# Patient Record
Sex: Male | Born: 1960 | Race: Black or African American | Hispanic: No | Marital: Single | Smoking: Current every day smoker
Health system: Southern US, Community
[De-identification: ages and names within clinical notes are randomized; demographics above are authoritative.]

---

## 2012-10-22 ENCOUNTER — Emergency Department (HOSPITAL_COMMUNITY)

## 2012-10-22 ENCOUNTER — Emergency Department (HOSPITAL_COMMUNITY)
Admission: EM | Admit: 2012-10-22 | Discharge: 2012-10-22 | Disposition: A | Discharge status: Home / Self Care | Attending: Emergency Medicine | Admitting: Emergency Medicine

## 2012-10-22 ENCOUNTER — Encounter (HOSPITAL_COMMUNITY): Payer: Self-pay | Admitting: Emergency Medicine

## 2012-10-22 DIAGNOSIS — M549 Dorsalgia, unspecified: Secondary | ICD-10-CM

## 2012-10-22 DIAGNOSIS — M545 Low back pain, unspecified: Secondary | ICD-10-CM | POA: Insufficient documentation

## 2012-10-22 DIAGNOSIS — Y929 Unspecified place or not applicable: Secondary | ICD-10-CM | POA: Insufficient documentation

## 2012-10-22 DIAGNOSIS — S335XXA Sprain of ligaments of lumbar spine, initial encounter: Secondary | ICD-10-CM | POA: Insufficient documentation

## 2012-10-22 DIAGNOSIS — T148XXA Other injury of unspecified body region, initial encounter: Secondary | ICD-10-CM

## 2012-10-22 DIAGNOSIS — F17200 Nicotine dependence, unspecified, uncomplicated: Secondary | ICD-10-CM | POA: Insufficient documentation

## 2012-10-22 DIAGNOSIS — Y939 Activity, unspecified: Secondary | ICD-10-CM | POA: Insufficient documentation

## 2012-10-22 DIAGNOSIS — X58XXXA Exposure to other specified factors, initial encounter: Secondary | ICD-10-CM | POA: Insufficient documentation

## 2012-10-22 MED ORDER — METHOCARBAMOL 500 MG PO TABS: 500.0000 mg | ORAL_TABLET | Freq: Two times a day (BID) | ORAL | Status: DC | PRN

## 2012-10-22 MED ORDER — HYDROCODONE-ACETAMINOPHEN 5-325 MG PO TABS
2.0000 | ORAL_TABLET | Freq: Once | ORAL | Status: AC
  Administered 2012-10-22: 2 via ORAL
  Filled 2012-10-22: qty 2

## 2012-10-22 MED ORDER — HYDROCODONE-ACETAMINOPHEN 5-325 MG PO TABS: 2.0000 | ORAL_TABLET | Freq: Four times a day (QID) | ORAL | Status: DC | PRN

## 2012-10-22 NOTE — ED Notes (Signed)
Back pain started today  Lower

## 2012-10-22 NOTE — ED Notes (Signed)
Discharge instructions reviewed. Pt verbalized understanding.  

## 2012-10-22 NOTE — ED Provider Notes (Signed)
History     CSN: [id number]  Arrival date & time 10/22/12  1350   First MD Initiated Contact with Patient 10/22/12 1411      Chief Complaint  Patient presents with  . Back Pain    (Consider location/radiation/quality/duration/timing/severity/associated sxs/prior treatment) HPI Comments: Patient is a 52 year old male who presents with new onset back pain this morning. He does not recall an injury or doing anything out of the ordinary yesterday. He reports pain across his low back. No numbness or tingling radiating down the legs. No weakness. Ambulatory without antalgic gait. Movement makes the pain worse. Nothing has made the pain better. No hx of CA, IVDA, bowel or bladder incontinence. No fevers, chills, abdominal pain, shortness of breath, chest pain.   The history is provided by the patient. No language interpreter was used.    History reviewed. No pertinent past medical history.  No past surgical history on file.  No family history on file.  History  Substance Use Topics  . Smoking status: Current Every Day Smoker  . Smokeless tobacco: Not on file  . Alcohol Use: Yes      Review of Systems  Constitutional: Negative for fever and chills.  Respiratory: Negative for shortness of breath.   Cardiovascular: Negative for chest pain and leg swelling.  Gastrointestinal: Negative for nausea, vomiting and abdominal pain.  Musculoskeletal: Positive for back pain. Negative for gait problem.  Neurological: Negative for numbness.  All other systems reviewed and are negative.    Allergies  Review of patient's allergies indicates no known allergies.  Home Medications  No current outpatient prescriptions on file.  BP 99/58  Pulse 58  Temp(Src) 98.6 F (37 C)  Resp 16  SpO2 99%  Physical Exam  Nursing note and vitals reviewed. Constitutional: He is oriented to person, place, and time. Vital signs are normal. He appears well-developed and well-nourished. No distress.   HENT:  Head: Normocephalic and atraumatic.  Right Ear: External ear normal.  Left Ear: External ear normal.  Nose: Nose normal.  Eyes: Conjunctivae are normal.  Neck: Normal range of motion. No tracheal deviation present.  Cardiovascular: Normal rate, regular rhythm, normal heart sounds, intact distal pulses and normal pulses.   Pulmonary/Chest: Effort normal and breath sounds normal. No stridor.  Abdominal: Soft. He exhibits no distension. There is no tenderness.  Musculoskeletal: Normal range of motion.       Thoracic back: He exhibits no tenderness and no bony tenderness.       Lumbar back: He exhibits tenderness, bony tenderness, pain and spasm. He exhibits no deformity.  No deformity, step-offs  Neurological: He is alert and oriented to person, place, and time. He has normal strength. No sensory deficit. He exhibits normal muscle tone. Coordination and gait normal.  Skin: Skin is warm and dry. He is not diaphoretic.  Psychiatric: He has a normal mood and affect. His behavior is normal.    ED Course  Procedures (including critical care time)  Labs Reviewed - No data to display Dg Lumbar Spine Complete  10/22/2012  *RADIOLOGY REPORT*  Clinical Data: Low back pain  LUMBAR SPINE - COMPLETE 4+ VIEW  Comparison:  None.  Findings:  There is no evidence of lumbar spine fracture. Alignment is normal.  Intervertebral disc spaces are maintained. Atheromatous aortic calcification noted without calcified aneurysm.  IMPRESSION: Negative.   Original Report Authenticated By: Christiana Pellant, M.D.      1. Back pain   2. Muscle strain  MDM  Patient with back pain.  No neurological deficits and normal neuro exam.  Patient can walk but states is painful.  No loss of bowel or bladder control.  No concern for cauda equina.  No fever, night sweats, weight loss, h/o cancer, IVDU.  RICE protocol and pain medicine indicated and discussed with patient.    Mora Bellman, PA-C 10/23/12 2033

## 2012-10-24 NOTE — ED Provider Notes (Signed)
Medical screening examination/treatment/procedure(s) were performed by non-physician practitioner and as supervising physician I was immediately available for consultation/collaboration.  Martha K Linker, MD 10/24/12 0706 

## 2012-12-29 ENCOUNTER — Encounter (HOSPITAL_COMMUNITY): Payer: Self-pay | Admitting: Nurse Practitioner

## 2012-12-29 ENCOUNTER — Emergency Department (HOSPITAL_COMMUNITY)
Admission: EM | Admit: 2012-12-29 | Discharge: 2012-12-29 | Disposition: A | Discharge status: Home / Self Care | Attending: Emergency Medicine | Admitting: Emergency Medicine

## 2012-12-29 DIAGNOSIS — Y939 Activity, unspecified: Secondary | ICD-10-CM | POA: Insufficient documentation

## 2012-12-29 DIAGNOSIS — S0993XA Unspecified injury of face, initial encounter: Secondary | ICD-10-CM | POA: Insufficient documentation

## 2012-12-29 DIAGNOSIS — F17200 Nicotine dependence, unspecified, uncomplicated: Secondary | ICD-10-CM | POA: Insufficient documentation

## 2012-12-29 DIAGNOSIS — S199XXA Unspecified injury of neck, initial encounter: Secondary | ICD-10-CM | POA: Insufficient documentation

## 2012-12-29 DIAGNOSIS — Y92410 Unspecified street and highway as the place of occurrence of the external cause: Secondary | ICD-10-CM | POA: Insufficient documentation

## 2012-12-29 DIAGNOSIS — M7918 Myalgia, other site: Secondary | ICD-10-CM

## 2012-12-29 DIAGNOSIS — IMO0002 Reserved for concepts with insufficient information to code with codable children: Secondary | ICD-10-CM | POA: Insufficient documentation

## 2012-12-29 MED ORDER — DIAZEPAM 5 MG PO TABS: ORAL_TABLET | ORAL | Status: DC

## 2012-12-29 MED ORDER — OXYCODONE-ACETAMINOPHEN 5-325 MG PO TABS: ORAL_TABLET | ORAL | Status: DC
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
## 2012-12-30 NOTE — ED Provider Notes (Signed)
Medical screening examination/treatment/procedure(s) were performed by non-physician practitioner and as supervising physician I was immediately available for consultation/collaboration.   Ashby Dawes, MD 12/30/12 [phone]

## 2014-03-27 ENCOUNTER — Emergency Department (HOSPITAL_COMMUNITY)
Admission: EM | Admit: 2014-03-27 | Discharge: 2014-03-27 | Disposition: A | Discharge status: Home / Self Care | Attending: Emergency Medicine | Admitting: Emergency Medicine

## 2014-03-27 ENCOUNTER — Emergency Department (HOSPITAL_COMMUNITY)

## 2014-03-27 ENCOUNTER — Encounter (HOSPITAL_COMMUNITY): Payer: Self-pay | Admitting: Emergency Medicine

## 2014-03-27 DIAGNOSIS — R112 Nausea with vomiting, unspecified: Secondary | ICD-10-CM | POA: Diagnosis not present

## 2014-03-27 DIAGNOSIS — J209 Acute bronchitis, unspecified: Secondary | ICD-10-CM | POA: Insufficient documentation

## 2014-03-27 DIAGNOSIS — R197 Diarrhea, unspecified: Secondary | ICD-10-CM | POA: Diagnosis not present

## 2014-03-27 DIAGNOSIS — F17200 Nicotine dependence, unspecified, uncomplicated: Secondary | ICD-10-CM

## 2014-03-27 DIAGNOSIS — Z720 Tobacco use: Secondary | ICD-10-CM | POA: Insufficient documentation

## 2014-03-27 DIAGNOSIS — J40 Bronchitis, not specified as acute or chronic: Secondary | ICD-10-CM

## 2014-03-27 DIAGNOSIS — R079 Chest pain, unspecified: Secondary | ICD-10-CM | POA: Diagnosis present

## 2014-03-27 LAB — CBC WITH DIFFERENTIAL/PLATELET
BASOS ABS: 0.1 10*3/uL (ref 0.0–0.1)
BASOS PCT: 1 % (ref 0–1)
Eosinophils Absolute: 0.2 10*3/uL (ref 0.0–0.7)
Eosinophils Relative: 2 % (ref 0–5)
HCT: 47.4 % (ref 39.0–52.0)
HEMOGLOBIN: 16.4 g/dL (ref 13.0–17.0)
Lymphocytes Relative: 23 % (ref 12–46)
Lymphs Abs: 1.9 10*3/uL (ref 0.7–4.0)
MCH: 31.9 pg (ref 26.0–34.0)
MCHC: 34.6 g/dL (ref 30.0–36.0)
MCV: 92.2 fL (ref 78.0–100.0)
MONOS PCT: 9 % (ref 3–12)
Monocytes Absolute: 0.7 10*3/uL (ref 0.1–1.0)
NEUTROS ABS: 5.5 10*3/uL (ref 1.7–7.7)
NEUTROS PCT: 65 % (ref 43–77)
PLATELETS: 220 10*3/uL (ref 150–400)
RBC: 5.14 MIL/uL (ref 4.22–5.81)
RDW: 14 % (ref 11.5–15.5)
WBC: 8.4 10*3/uL (ref 4.0–10.5)

## 2014-03-27 LAB — BASIC METABOLIC PANEL
Anion gap: 17 — ABNORMAL HIGH (ref 5–15)
BUN: 9 mg/dL (ref 6–23)
CO2: 22 meq/L (ref 19–32)
Calcium: 9.5 mg/dL (ref 8.4–10.5)
Chloride: 100 mEq/L (ref 96–112)
Creatinine, Ser: 1.28 mg/dL (ref 0.50–1.35)
GFR calc Af Amer: 72 mL/min — ABNORMAL LOW (ref >90)
GFR, EST NON AFRICAN AMERICAN: 62 mL/min — AB (ref >90)
GLUCOSE: 96 mg/dL (ref 70–99)
POTASSIUM: 4.5 meq/L (ref 3.7–5.3)
SODIUM: 139 meq/L (ref 137–147)

## 2014-03-27 LAB — I-STAT CG4 LACTIC ACID, ED: LACTIC ACID, VENOUS: 0.7 mmol/L (ref 0.5–2.2)

## 2014-03-27 LAB — I-STAT TROPONIN, ED
TROPONIN I, POC: 0 ng/mL (ref 0.00–0.08)
TROPONIN I, POC: 0 ng/mL (ref 0.00–0.08)

## 2014-03-27 MED ORDER — HYDROCODONE-ACETAMINOPHEN 7.5-325 MG/15ML PO SOLN: 15.0000 mL | Freq: Three times a day (TID) | ORAL | Status: AC | PRN

## 2014-03-27 MED ORDER — METHYLPREDNISOLONE SODIUM SUCC 125 MG IJ SOLR
125.0000 mg | Freq: Once | INTRAMUSCULAR | Status: AC
  Administered 2014-03-27: 125 mg via INTRAVENOUS
  Filled 2014-03-27: qty 2

## 2014-03-27 MED ORDER — ALBUTEROL SULFATE HFA 108 (90 BASE) MCG/ACT IN AERS
2.0000 | INHALATION_SPRAY | Freq: Once | RESPIRATORY_TRACT | Status: AC
  Administered 2014-03-27: 2 via RESPIRATORY_TRACT
  Filled 2014-03-27: qty 6.7

## 2014-03-27 MED ORDER — PREDNISONE 50 MG PO TABS: ORAL_TABLET | ORAL | Status: AC

## 2014-03-27 MED ORDER — SODIUM CHLORIDE 0.9 % IV BOLUS (SEPSIS)
1000.0000 mL | Freq: Once | INTRAVENOUS | Status: AC
  Administered 2014-03-27: 1000 mL via INTRAVENOUS

## 2014-03-27 MED ORDER — MORPHINE SULFATE 4 MG/ML IJ SOLN
4.0000 mg | Freq: Once | INTRAMUSCULAR | Status: AC
Start: 2014-03-27 — End: 2014-03-27
  Administered 2014-03-27: 4 mg via INTRAVENOUS
  Filled 2014-03-27: qty 1

## 2014-03-27 MED ORDER — ONDANSETRON HCL 4 MG/2ML IJ SOLN
4.0000 mg | Freq: Once | INTRAMUSCULAR | Status: AC
  Administered 2014-03-27: 4 mg via INTRAVENOUS
  Filled 2014-03-27: qty 2

## 2014-03-27 MED ORDER — IPRATROPIUM-ALBUTEROL 0.5-2.5 (3) MG/3ML IN SOLN
3.0000 mL | Freq: Once | RESPIRATORY_TRACT | Status: AC
  Administered 2014-03-27: 3 mL via RESPIRATORY_TRACT
  Filled 2014-03-27: qty 3

## 2014-03-27 MED ORDER — PROMETHAZINE HCL 25 MG PO TABS: 25.0000 mg | ORAL_TABLET | Freq: Four times a day (QID) | ORAL | Status: AC | PRN

## 2014-03-27 MED ORDER — AZITHROMYCIN 250 MG PO TABS: ORAL_TABLET | ORAL | Status: DC

## 2014-03-27 NOTE — ED Provider Notes (Signed)
Medical screening examination/treatment/procedure(s) were performed by non-physician practitioner and as supervising physician I was immediately available for consultation/collaboration.   EKG Interpretation   Date/Time:  Monday March 27 2014 16:28:52 EDT Ventricular Rate:  86 PR Interval:  148 QRS Duration: 80 QT Interval:  368 QTC Calculation: 440 R Axis:   82 Text Interpretation:  Normal sinus rhythm Normal ECG No previous tracing  Confirmed by Anitra LauthPLUNKETT  MD, Alphonzo LemmingsWHITNEY ([id number]) on 03/27/2014 9:27:26 PM        Gwyneth SproutWhitney Gor Vestal, MD 03/27/14 2317

## 2014-03-27 NOTE — ED Notes (Signed)
Pt c/o mid sternal CP with cough x 2 days

## 2014-03-27 NOTE — ED Provider Notes (Signed)
CSN: [id number]     Arrival date & time 03/27/14  1618 History   First MD Initiated Contact with Patient 03/27/14 2049     Chief Complaint  Patient presents with  . Chest Pain     (Consider location/radiation/quality/duration/timing/severity/associated sxs/prior Treatment) HPI  Dareen PianoJames Stclair is a 53 y.o. male complaining of pleuritic bilateral anterior chest pain, productive cough, chills, shortness of breath, several episodes of nonbloody, nonbilious, coffee-ground emesis, several episodes of loose stool worsening over the course of 3 days. Patient denies sick contacts, headache, cervicalgia, rash, abdominal pain. Patient is active daily smoker, has not had his flu shot, has no history of COPD diagnosis. History of DVT or PE, recent immobilization, calf pain or leg swelling.  [facility]   History reviewed. No pertinent past medical history. History reviewed. No pertinent past surgical history. History reviewed. No pertinent family history. History  Substance Use Topics  . Smoking status: Current Every Day Smoker    Types: Cigarettes  . Smokeless tobacco: Not on file  . Alcohol Use: Yes    Review of Systems  10 systems reviewed and found to be negative, except as noted in the HPI.    Allergies  Review of patient's allergies indicates no known allergies.  Home Medications   Prior to Admission medications   Medication Sig Start Date End Date Taking? Authorizing Provider  azithromycin (ZITHROMAX Z-PAK) 250 MG tablet 2 po day one, then 1 daily x 4 days 03/27/14   Joni ReiningNicole Davyd Podgorski, PA-C  HYDROcodone-acetaminophen (HYCET) 7.5-325 mg/15 ml solution Take 15 mLs by mouth every 8 (eight) hours as needed for moderate pain. 03/27/14   Krystian Ferrentino, PA-C  predniSONE (DELTASONE) 50 MG tablet Take 1 tablet daily with breakfast 03/27/14   Joni ReiningNicole Jacksen Isip, PA-C  promethazine (PHENERGAN) 25 MG tablet Take 1 tablet (25 mg total) by mouth every 6 (six) hours as needed for nausea or vomiting.  03/27/14   Jayona Mccaig, PA-C   BP 130/77  Pulse 83  Temp(Src) 98.8 F (37.1 C) (Oral)  Resp 20  Ht 5\' 8"  (1.727 m)  Wt 145 lb (65.772 kg)  BMI 22.05 kg/m2  SpO2 96% Physical Exam  Constitutional: He appears well-developed.  HENT:  Head: Normocephalic and atraumatic.  Mouth/Throat: Oropharynx is clear and moist.  Eyes: Conjunctivae and EOM are normal. Pupils are equal, round, and reactive to light.  Neck: Normal range of motion.  Cardiovascular: Normal rate, regular rhythm and intact distal pulses.   Pulmonary/Chest: Effort normal and breath sounds normal. No respiratory distress. He has no wheezes. He has no rales. He exhibits no tenderness.  Slightly Reduced or movement in all fields  Abdominal: Soft. Bowel sounds are normal. He exhibits no distension and no mass. There is no tenderness. There is no rebound and no guarding.  Musculoskeletal: Normal range of motion. He exhibits no edema and no tenderness.  No calf asymmetry, superficial collaterals, palpable cords, edema, Homans sign negative bilaterally.      ED Course  Procedures (including critical care time) Labs Review Labs Reviewed  BASIC METABOLIC PANEL - Abnormal; Notable for the following:    GFR calc non Af Amer 62 (*)    GFR calc Af Amer 72 (*)    Anion gap 17 (*)    All other components within normal limits  CBC WITH DIFFERENTIAL  I-STAT TROPOININ, ED  I-STAT CG4 LACTIC ACID, ED  Rosezena SensorI-STAT TROPOININ, ED    Imaging Review Dg Chest 2 View  03/27/2014   CLINICAL DATA:  Chest pain.  Cough and fever starting 2 days ago.  EXAM: CHEST  2 VIEW  COMPARISON:  None.  FINDINGS: Tapering of the peripheral pulmonary vasculature favors emphysema. Cardiac and mediastinal margins appear normal. No pleural effusion identified. The lungs appear otherwise clear.  IMPRESSION: 1. Suspected emphysema. Otherwise, no significant abnormalities are observed.   Electronically Signed   By: Herbie BaltimoreWalt  Liebkemann M.D.   On: 03/27/2014 17:23      EKG Interpretation   Date/Time:  Monday March 27 2014 16:28:52 EDT Ventricular Rate:  86 PR Interval:  148 QRS Duration: 80 QT Interval:  368 QTC Calculation: 440 R Axis:   82 Text Interpretation:  Normal sinus rhythm Normal ECG No previous tracing  Confirmed by Anitra LauthPLUNKETT  MD, WHITNEY ([id number]) on 03/27/2014 9:27:26 PM      MDM   Final diagnoses:  Bronchitis  Tobacco use disorder  Nausea vomiting and diarrhea    Filed Vitals:   03/27/14 1637  BP: 130/77  Pulse: 83  Temp: 98.8 F (37.1 C)  TempSrc: Oral  Resp: 20  Height: 5\' 8"  (1.727 m)  Weight: 145 lb (65.772 kg)  SpO2: 96%    Medications  sodium chloride 0.9 % bolus 1,000 mL (1,000 mLs Intravenous New Bag/Given 03/27/14 2152)  albuterol (PROVENTIL HFA;VENTOLIN HFA) 108 (90 BASE) MCG/ACT inhaler 2 puff (not administered)  ondansetron (ZOFRAN) injection 4 mg (4 mg Intravenous Given 03/27/14 2152)  morphine 4 MG/ML injection 4 mg (4 mg Intravenous Given 03/27/14 2156)  methylPREDNISolone sodium succinate (SOLU-MEDROL) 125 mg/2 mL injection 125 mg (125 mg Intravenous Given 03/27/14 2155)  ipratropium-albuterol (DUONEB) 0.5-2.5 (3) MG/3ML nebulizer solution 3 mL (3 mLs Nebulization Given 03/27/14 2157)    Dareen PianoJames Matos is a 53 y.o. male presenting with productive cough, pleuritic chest pain and nausea vomiting diarrhea with associated symptoms of chills and fatigue. Patient is active daily smoker, has reduced or movement in all fields, chest x-ray shows emphysema but no infiltrate. Patient has nonischemic EKG, troponin is negative, blood work with no significant abnormality. Patient will be given Solu-Medrol, albuterol treatment and morphine.  Lung exam without change after nebulizer and Solu-Medrol. Patient will be given albuterol pump in the ED. I've advised the patient on smoking cessation, asked him to follow with his primary care physician and also [facility]  Evaluation does not show pathology  that would require ongoing emergent intervention or inpatient treatment. Pt is hemodynamically stable and mentating appropriately. Discussed findings and plan with patient/guardian, who agrees with care plan. All questions answered. Return precautions discussed and outpatient follow up given.   New Prescriptions   AZITHROMYCIN (ZITHROMAX Z-PAK) 250 MG TABLET    2 po day one, then 1 daily x 4 days   HYDROCODONE-ACETAMINOPHEN (HYCET) 7.5-325 MG/15 ML SOLUTION    Take 15 mLs by mouth every 8 (eight) hours as needed for moderate pain.   PREDNISONE (DELTASONE) 50 MG TABLET    Take 1 tablet daily with breakfast   PROMETHAZINE (PHENERGAN) 25 MG TABLET    Take 1 tablet (25 mg total) by mouth every 6 (six) hours as needed for nausea or vomiting.         Wynetta Emeryicole Lucita Montoya, PA-C 03/27/14 2222

## 2014-03-27 NOTE — ED Notes (Signed)
Pt st's he has felt bad x's 2-3 days with productive cough, headache and sinus pain.

## 2014-03-27 NOTE — Discharge Instructions (Signed)
Try to quit smoking and follow with your primary care Dr. [facility]   Take Hycet  for cough and pain. Do not drink alcohol, drive, care for children or do other critical tasks while taking Hycet.   Take your antibiotics as directed and to completion. You should never have any leftover antibiotics! Push fluids and stay well hydrated.   Please follow with your primary care doctor in the next 2 days for a check-up. They must obtain records for further management.   Do not hesitate to return to the Emergency Department for any new, worsening or concerning symptoms.

## 2014-03-30 ENCOUNTER — Encounter (HOSPITAL_COMMUNITY): Payer: Self-pay | Admitting: Emergency Medicine

## 2014-03-30 ENCOUNTER — Emergency Department (HOSPITAL_COMMUNITY)
Admission: EM | Admit: 2014-03-30 | Discharge: 2014-03-30 | Disposition: A | Discharge status: Home / Self Care | Attending: Emergency Medicine | Admitting: Emergency Medicine

## 2014-03-30 ENCOUNTER — Emergency Department (HOSPITAL_COMMUNITY)

## 2014-03-30 DIAGNOSIS — Z792 Long term (current) use of antibiotics: Secondary | ICD-10-CM | POA: Diagnosis not present

## 2014-03-30 DIAGNOSIS — Z7952 Long term (current) use of systemic steroids: Secondary | ICD-10-CM | POA: Insufficient documentation

## 2014-03-30 DIAGNOSIS — Z720 Tobacco use: Secondary | ICD-10-CM | POA: Diagnosis not present

## 2014-03-30 DIAGNOSIS — R079 Chest pain, unspecified: Secondary | ICD-10-CM | POA: Diagnosis present

## 2014-03-30 DIAGNOSIS — J40 Bronchitis, not specified as acute or chronic: Secondary | ICD-10-CM

## 2014-03-30 LAB — BASIC METABOLIC PANEL
Anion gap: 10 (ref 5–15)
BUN: 9 mg/dL (ref 6–23)
CO2: 28 mEq/L (ref 19–32)
Calcium: 9.3 mg/dL (ref 8.4–10.5)
Chloride: 105 mEq/L (ref 96–112)
Creatinine, Ser: 1.2 mg/dL (ref 0.50–1.35)
GFR calc Af Amer: 78 mL/min — ABNORMAL LOW (ref >90)
GFR calc non Af Amer: 67 mL/min — ABNORMAL LOW (ref >90)
Glucose, Bld: 100 mg/dL — ABNORMAL HIGH (ref 70–99)
Potassium: 3.9 mEq/L (ref 3.7–5.3)
Sodium: 143 mEq/L (ref 137–147)

## 2014-03-30 LAB — CBC
HCT: 42.7 % (ref 39.0–52.0)
Hemoglobin: 14.3 g/dL (ref 13.0–17.0)
MCH: 31.2 pg (ref 26.0–34.0)
MCHC: 33.5 g/dL (ref 30.0–36.0)
MCV: 93.2 fL (ref 78.0–100.0)
Platelets: 254 10*3/uL (ref 150–400)
RBC: 4.58 MIL/uL (ref 4.22–5.81)
RDW: 13.7 % (ref 11.5–15.5)
WBC: 6.6 10*3/uL (ref 4.0–10.5)

## 2014-03-30 LAB — I-STAT TROPONIN, ED: Troponin i, poc: 0 ng/mL (ref 0.00–0.08)

## 2014-03-30 MED ORDER — ONDANSETRON HCL 4 MG/2ML IJ SOLN
4.0000 mg | Freq: Once | INTRAMUSCULAR | Status: AC
  Administered 2014-03-30: 4 mg via INTRAVENOUS
  Filled 2014-03-30: qty 2

## 2014-03-30 MED ORDER — ALBUTEROL SULFATE HFA 108 (90 BASE) MCG/ACT IN AERS
2.0000 | INHALATION_SPRAY | Freq: Once | RESPIRATORY_TRACT | Status: AC
  Administered 2014-03-30: 2 via RESPIRATORY_TRACT
  Filled 2014-03-30: qty 6.7

## 2014-03-30 MED ORDER — SODIUM CHLORIDE 0.9 % IV BOLUS (SEPSIS)
1000.0000 mL | Freq: Once | INTRAVENOUS | Status: AC
  Administered 2014-03-30: 1000 mL via INTRAVENOUS

## 2014-03-30 MED ORDER — KETOROLAC TROMETHAMINE 15 MG/ML IJ SOLN
15.0000 mg | Freq: Once | INTRAMUSCULAR | Status: AC
  Administered 2014-03-30: 15 mg via INTRAVENOUS
  Filled 2014-03-30: qty 1

## 2014-03-30 MED ORDER — AMOXICILLIN 500 MG PO CAPS: 1000.0000 mg | ORAL_CAPSULE | Freq: Two times a day (BID) | ORAL | Status: AC

## 2014-03-30 MED ORDER — DEXTROSE 5 % IV SOLN
1.0000 g | Freq: Once | INTRAVENOUS | Status: AC
  Administered 2014-03-30: 1 g via INTRAVENOUS
  Filled 2014-03-30: qty 10

## 2014-03-30 MED ORDER — DEXAMETHASONE SODIUM PHOSPHATE 4 MG/ML IJ SOLN
12.0000 mg | Freq: Once | INTRAMUSCULAR | Status: AC
  Administered 2014-03-30: 12 mg via INTRAVENOUS
  Filled 2014-03-30: qty 3

## 2014-03-30 NOTE — Discharge Instructions (Signed)
°Emergency Department Resource Guide °1) Find a Doctor and Pay Out of Pocket °Although you won't have to find out who is covered by your insurance plan, it is a good idea to ask around and get recommendations. You will then need to call the office and see if the doctor you have chosen will accept you as a new patient and what types of options they offer for patients who are self-pay. Some doctors offer discounts or will set up payment plans for their patients who do not have insurance, but you will need to ask so you aren't surprised when you get to your appointment. ° °2) Contact Your Local Health Department °Not all health departments have doctors that can see patients for sick visits, but many do, so it is worth a call to see if yours does. If you don't know where your local health department is, you can check in your phone book. The CDC also has a tool to help you locate your state's health department, and many state websites also have listings of all of their local health departments. ° °3) Find a Walk-in Clinic °If your illness is not likely to be very severe or complicated, you may want to try a walk in clinic. These are popping up all over the country in pharmacies, drugstores, and shopping centers. They're usually staffed by nurse practitioners or physician assistants that have been trained to treat common illnesses and complaints. They're usually fairly quick and inexpensive. However, if you have serious medical issues or chronic medical problems, these are probably not your best option. ° °No Primary Care Doctor: °- Call [facility] at  832-8000 - they can help you locate a primary care doctor that  accepts your insurance, provides certain services, etc. °- Physician Referral Service- 1-800-533-3463 ° °Chronic Pain Problems: °Organization         Address  Phone   Notes  °[facility]  (336) 297-2271 Patients need to be referred by their primary care doctor.  ° °Medication  Assistance: °Organization         Address  Phone   Notes  °[place] [facility] [address] °[place], [place] [address] (336) 641-8030 --Must be a resident of [place] °-- Must have NO insurance coverage whatsoever (no Medicaid/ Medicare, etc.) °-- The pt. MUST have a primary care doctor that directs their care regularly and follows them in the community °  °[facility]  (866) 331-1348   °[employer]  (888) 892-1162   ° °Agencies that provide inexpensive medical care: °Organization         Address  Phone   Notes  °[facility]  (336) 832-8035   ° Internal Medicine    (336) 832-7272   °[facility] [address] °[place], [place] [address] (336) 832-4777   °[facility] [address], °[place] (336) 271-4999   °[facility]    (336) 373-0678   °[facility]    (336) 272-1050   °[facility] ° [address], [place] Phone:  (336) 832-4444, Fax:  (336) 832-4440 Hours of Operation:  9 am - 6 pm, M-F.  Also accepts Medicaid/Medicare and self-pay.  °[facility] ° [address],  Phone: (336) 832-3150, Fax: (336) 832-3151. Hours of Operation:  8:30 am - 5:30 pm, M-F.  Also accepts Medicaid and self-pay.  °[facility] [place] [address], [place] Phone: (336) 878-6027   °[facility] [address], [place], [place] (336)723-1848, Ext. 123 Mondays & Thursdays: 7-9 AM.  First 15 patients are seen on a first come, first serve basis. °  ° °Medicaid-accepting [place] Providers: ° °Organization         Address  Phone   Notes  °[facility] [address], [place] (336) 641-2100 Also accepts self-pay patients.  °[facility] [address], [place] ° (336) 856-9996   °[facility] [address], [place]  (336) 288-8857   °[facility] [address] [place] [address], [place] (336) 299-7000   °Veita Bland [address], [place]  ° (336) 373-1557 Only accepts [place] Access Medicaid patients after they have their name applied to their card.  ° °Self-Pay (no insurance) in [place]: ° °Organization         Address  Phone   Notes  °Sickle Cell Patients, [facility] [address], [place] (336) 832-1970   °[facility] [address], [place] (336) 832-4400   °[facility] ° [address], [place] (336) 992-4800   °[facility]/Dr. Osei-Bonsu ° [address] [place] [address], [place] or [address], [place] (336) 841-8500 Phone number for both [place] and [place] locations is the same.  °[facility] [address], [place] (336) 299-0000   °[facility] [address] [place] [address], [place] or [address] (336) 852-7530 °(336) 878-2260   °[facility] [address], [place] (336) 350-1642, phone; (336) 294-5005, fax Sees patients 1st and 3rd Saturday of every month.  Must not qualify for public or private insurance (i.e. Medicaid, Medicare, [place] Health Choice, Veterans' Benefits) • Household income should be no more than 200% of the poverty level •The clinic cannot treat you if you are pregnant or think you are pregnant • Sexually transmitted diseases are not treated at the clinic.  ° ° °Dental Care: °Organization         Address  Phone  Notes  °[place] [facility] [address], [place] (336) 641-6152 Accepts children up to age 21 who are enrolled in Medicaid or [place] Health Choice; pregnant women with a Medicaid card; and children who have applied for Medicaid or [place] Health Choice, but were declined, whose parents can pay a reduced fee at time of service.  °[place]  [facility] [place]  [address], [place] (336) 641-7733 Accepts children up to age 21 who are enrolled in Medicaid or [place] Health Choice; pregnant women with a Medicaid card; and children who have applied for Medicaid or [place] Health Choice, but were declined, whose parents can pay a reduced fee at time of service.  °[facility] ° [address],  (336) 641-4533 Patients are seen by appointment only. Walk-ins are not accepted. [facility] will see patients 18 years of age and older. °Monday - Tuesday (8am-5pm) °Most Wednesdays (8:30-5pm) °$30 per visit, cash only  °[facility] ° [address], [place] (336) 641-4533 Patients are seen by appointment only. Walk-ins are not accepted. [facility] will see patients 18 years of age and older. °One   Wednesday Evening (Monthly: Volunteer Based).  $30 per visit, cash only  °[facility]  (919) 537-3737 for adults; Children under age 4, call Graduate Pediatric Dentistry at (919) [phone]. Children aged 4-14, please call (919) [phone] to request a pediatric application. ° Dental services are provided in all areas of dental care including fillings, crowns and bridges, complete and partial dentures, implants, gum treatment, root canals, and extractions. Preventive care is also provided. Treatment is provided to both adults and children. °Patients are selected via a lottery and there is often a waiting list. °  °[facility] [address], °[place] ° (336) 763-8833 [email or web address] °  °[facility] [address], [place], [place] (336)723-1848, Ext. 123 Second and Fourth Thursday of each month, opens at 6:30 AM; Clinic ends at 9 AM.  Patients are seen on a first-come first-served basis, and a limited number are seen during each clinic.  ° °[facility] ° [address], [place], [place] (336) 723-7904    Eligibility Requirements °You must have lived in [place], [place], or [place] counties for at least the last three months. °  You cannot be eligible for state or federal sponsored healthcare insurance, including Veterans Administration, Medicaid, or Medicare. °  You generally cannot be eligible for healthcare insurance through your employer.  °  How to apply: °Eligibility screenings are held every Tuesday and Wednesday afternoon from 1:00 pm until 4:00 pm. You do not need an appointment for the interview!  °[facility] [address], [place], [place] 336-631-2330   °[facility] [facility]  336-342-8273   °[facility]  336-703-3100   °[facility]  336-570-6415   ° °Behavioral Health Resources in the Community: °Intensive Outpatient Programs °Organization         Address  Phone  Notes  °[facility] [address], [place], [place] 336-878-6098   °[facility] [address], [place], [place] 336-832-9800   °[facility] [address], [place], [place] ° 336-882-2125   °[facility] [address],  °[place], [place] 1-800-853-5163 or 336-641-4981   °Substance Abuse Resources °Organization         Address  Phone  Notes  °[facility]  336-882-2125   °[facility]  336-784-9470   °[facility]  336-285-9073   °[facility]  336-845-3988   °Residential & Outpatient Substance Abuse Program  1-800-659-3381   °Psychological Services °Organization         Address  Phone  Notes  °[facility]  336- 832-9600   °[facility]  336- 378-7881   °[facility] [address], [place] 1-800-853-5163 or 336-641-4981   ° °Mobile Crisis Teams °Organization         Address  Phone  Notes  °[facility], Mobile Crisis Care Unit  1-877-626-1772   °Assertive °[facility] ° [address]  [place], [place] 336-834-9664   °Sharon DeEsch [address] °[place] 336-554-5454   ° °Self-Help/Support Groups °Organization         Address  Phone             Notes  °[facility] [place] [facility] - variety of support groups  336- 373-1402 Call for more information  °Narcotics Anonymous (NA), [facility] [address], °[place]  2 meetings at this location  ° °  Residential Treatment Programs °Organization         Address  Phone  Notes  °[facility] [address],    °[place]  1-866-801-8205   °[facility] ° [address], [place], [place] 704-293-8524   °[facility] [address], [place] 336-845-3988 Admissions: 8am-3pm M-F  °[facility] [address]    °[place], [place] 336-841-1104   °[facility] [address], [place], [place] 336-379-7146   °[facility] [address]  °[place], [place] 336-285-9073   °[facility] - Intensive Outpatient [address], [place], [place] 336-852-3033   °[facility] [address]  °[place], [place] 1-877-615-2722 or 336-784-9470   °[facility] [address] [place], [place] 336-227-7417 Accepts Medicaid  °[facility] [address]  ° [place] 1-800-659-3381 Substance Abuse/Addiction Treatment  ° °[place] Behavioral Health Resources °Organization         Address  Phone  Notes  °[facility]  (888) 581-9988   °Julie Brannon, PhD [address] [place], [place]   (336) 349-5553 or (336) 951-0000   °[facility]   [address] °[place], [place] (336) 349-4454   °[facility] [address], [place], [place] (336) 342-8316 Insurance/Medicaid/sponsorship through [facility]  °[facility] [address]                                    [place], [place] (336) 342-8316 Therapy/tele-psych/case    °[facility] [address]  ° [place], [place] (336) 349-2233    °Dr. Arfeen  (336) 349-4544   °[facility] [place]  [employer] [place] [facility] 1) [address], [place] °2) [address], [place] °3)  [address], [place] (336) 349-3220 °(336) 342-7768 ° °(336) 342-8140   °[place] [facility] (336) 342-1394 or (336) 342-3537 (After Hours)    ° ° °

## 2014-03-30 NOTE — ED Provider Notes (Signed)
CSN: [id number]     Arrival date & time 03/30/14  1334 History   First MD Initiated Contact with Patient 03/30/14 1652     Chief Complaint  Patient presents with  . Chest Pain    body aches, back pain leg pain n/v/d abdominal pain     (Consider location/radiation/quality/duration/timing/severity/associated sxs/prior Treatment) HPI  53yM with multiple complaints. Seen in ED 10/12 and diagnosed with bronchitis. Prescribed prednisone, zithromax, phenergan and hycet. Unable to afford and only filled zithromax. Continues to have same respiratory symptoms. Subjective fever and chills. Feels achy all over. Nausea. Diarrhea. No sick contacts.   History reviewed. No pertinent past medical history. History reviewed. No pertinent past surgical history. No family history on file. History  Substance Use Topics  . Smoking status: Current Every Day Smoker    Types: Cigarettes  . Smokeless tobacco: Not on file  . Alcohol Use: Yes    Review of Systems  All systems reviewed and negative, other than as noted in HPI.   Allergies  Review of patient's allergies indicates no known allergies.  Home Medications   Prior to Admission medications   Medication Sig Start Date End Date Taking? Authorizing Provider  azithromycin (ZITHROMAX) 250 MG tablet Take 250 mg by mouth See admin instructions. 2 tablet by mouth on  day one, then 1 daily x 4 days. Started medication on 03-28-14 03/27/14  Yes Nicole Pisciotta, PA-C  HYDROcodone-acetaminophen (HYCET) 7.5-325 mg/15 ml solution Take 15 mLs by mouth every 8 (eight) hours as needed for moderate pain. 03/27/14  Yes Nicole Pisciotta, PA-C  predniSONE (DELTASONE) 50 MG tablet Take 1 tablet daily with breakfast 03/27/14  Yes Nicole Pisciotta, PA-C  promethazine (PHENERGAN) 25 MG tablet Take 1 tablet (25 mg total) by mouth every 6 (six) hours as needed for nausea or vomiting. 03/27/14  Yes Nicole Pisciotta, PA-C   BP 111/68  Pulse 75  Temp(Src) 98.1 F (36.7  C) (Oral)  Resp 16  SpO2 98% Physical Exam  Nursing note and vitals reviewed. Constitutional: He appears well-developed and well-nourished. No distress.  Sitting up in bed. NAD.   HENT:  Head: Normocephalic and atraumatic.  Eyes: Conjunctivae are normal. Right eye exhibits no discharge. Left eye exhibits no discharge.  Neck: Neck supple.  Cardiovascular: Normal rate, regular rhythm and normal heart sounds.  Exam reveals no gallop and no friction rub.   No murmur heard. Pulmonary/Chest: Effort normal. No respiratory distress. He has wheezes. He exhibits no tenderness.  Abdominal: Soft. He exhibits no distension. There is no tenderness.  Musculoskeletal: He exhibits no edema and no tenderness.  Lower extremities symmetric as compared to each other. No calf tenderness. Negative Homan's. No palpable cords.   Neurological: He is alert.  Skin: Skin is warm and dry. He is not diaphoretic.  Psychiatric: He has a normal mood and affect. His behavior is normal. Thought content normal.    ED Course  Procedures (including critical care time) Labs Review Labs Reviewed  BASIC METABOLIC PANEL - Abnormal; Notable for the following:    Glucose, Bld 100 (*)    GFR calc non Af Amer 67 (*)    GFR calc Af Amer 78 (*)    All other components within normal limits  CBC  I-STAT TROPOININ, ED    Imaging Review Dg Chest 2 View  03/30/2014   CLINICAL DATA:  53 year old male with left-sided chest pain. Productive cough for 1 week. Seventeen pack-year history.  EXAM: CHEST - 2 VIEW  COMPARISON:  03/27/2014  FINDINGS: Cardiomediastinal silhouette projects within normal limits in size and contour. No confluent airspace disease, pneumothorax, or pleural effusion. Interstitial opacities at the left base in the costophrenic angle.  No displaced fracture.  Unremarkable appearance of the upper abdomen.  IMPRESSION: Opacity at the left base concerning for developing bronchitis/bronchopneumonia given the patient's  history.  Early changes of emphysema.  Signed,  Yvone NeuJaime S. Loreta AveWagner, DO  [facility]  [facility]   Electronically Signed   By: Gilmer MorJaime  Wagner D.O.   On: 03/30/2014 19:28     EKG Interpretation   Date/Time:  Thursday March 30 2014 13:40:00 EDT Ventricular Rate:  77 PR Interval:  140 QRS Duration: 84 QT Interval:  384 QTC Calculation: 434 R Axis:   82 Text Interpretation:  Normal sinus rhythm with sinus arrhythmia t wave  flattening in v2 as compared to previous 10/12 otherwise no significant  change Confirmed by Juleen ChinaKOHUT  MD, Dejanira Pamintuan ([id number]) on 03/30/2014 4:54:50 PM      MDM   Final diagnoses:  Bronchitis    53yM with multiple complaints. Suspect he has bronchitis.CXR today suggestive of developing L sided opacity. Asked more than once and says he filled and has been taking azithromycin. Will give dose of rocephin. Script for amoxicillin. Not convinced he actually he has bacterial pneumonia though. Also not convinced he filled azithromycin. Amoxicillin very inexpensive. Afebrile. He appears well. Some wheezing on exam, but no respiratory distress. . Homeless and difficulty affording meds. Case management speaking with patient. Should be fine reagrdless with symptomatic tx. Will discharge with inhaler. Dose of decadron today. He is in NAD.     Raeford RazorStephen Dayana Dalporto, MD 04/02/14 [phone]

## 2014-03-30 NOTE — ED Notes (Signed)
Pt. Here for multiple complaints. States since last week has been feeling bad. Reports HA, N/V, abdominal pain, ear pain, throat pain, productive cough. Was seen here for same.

## 2014-03-30 NOTE — ED Notes (Signed)
Pt. Reports no change with medication. Pt. Sitting in bed, breathing unlabored, alert and oriented x4, does not appear in any distress.

## 2014-03-30 NOTE — Progress Notes (Signed)
03/29/14 9:10p Beecher McardleW. Shatasha Lambing RN NCM Patient being discharged, [facility]  03/29/14 6:11 Beecher McardleW. Toby Breithaupt RN NCM ED CM spoke with patient concerning establishing f/u care. Pt stated, he is a Dance movement psychotherapisthomeless Veteran,  Discussed the Homeless Vet [facility]. Patient became upset, he states, he has called and walked in to try and get assistance several times. Discussed and provided other homeless resources informations. [facility], a bed is available and on hold tonight for discharged. CM will attempt to f/u  with SA about homeless veterans program.

## 2014-03-30 NOTE — ED Notes (Signed)
Pt. Requesting to talk with social work. Case manager states she will come see and talk with social work as needed.

## 2014-03-30 NOTE — ED Notes (Signed)
Pt. Reports  Being here 2 days ago for the same and the symptoms have not improved.  He is having n/v/d. Chills and fever Also having headache, abdominal pain, back pain, leg pain. And weakness.  Pt. Is alert and oriented X3.  Skin is warm and dry, ECG completed in Triage.  Pt. Is in NAD

## 2015-07-07 IMAGING — CR DG CHEST 2V
2 series · 2 of 2 positions shown · non-contrast
Comparison: 03/27/2014

CLINICAL DATA: 53-year-old male with left-sided chest pain.
Productive cough for 1 week. Seventeen pack-year history.

EXAM:
CHEST - 2 VIEW

[w chest pa]
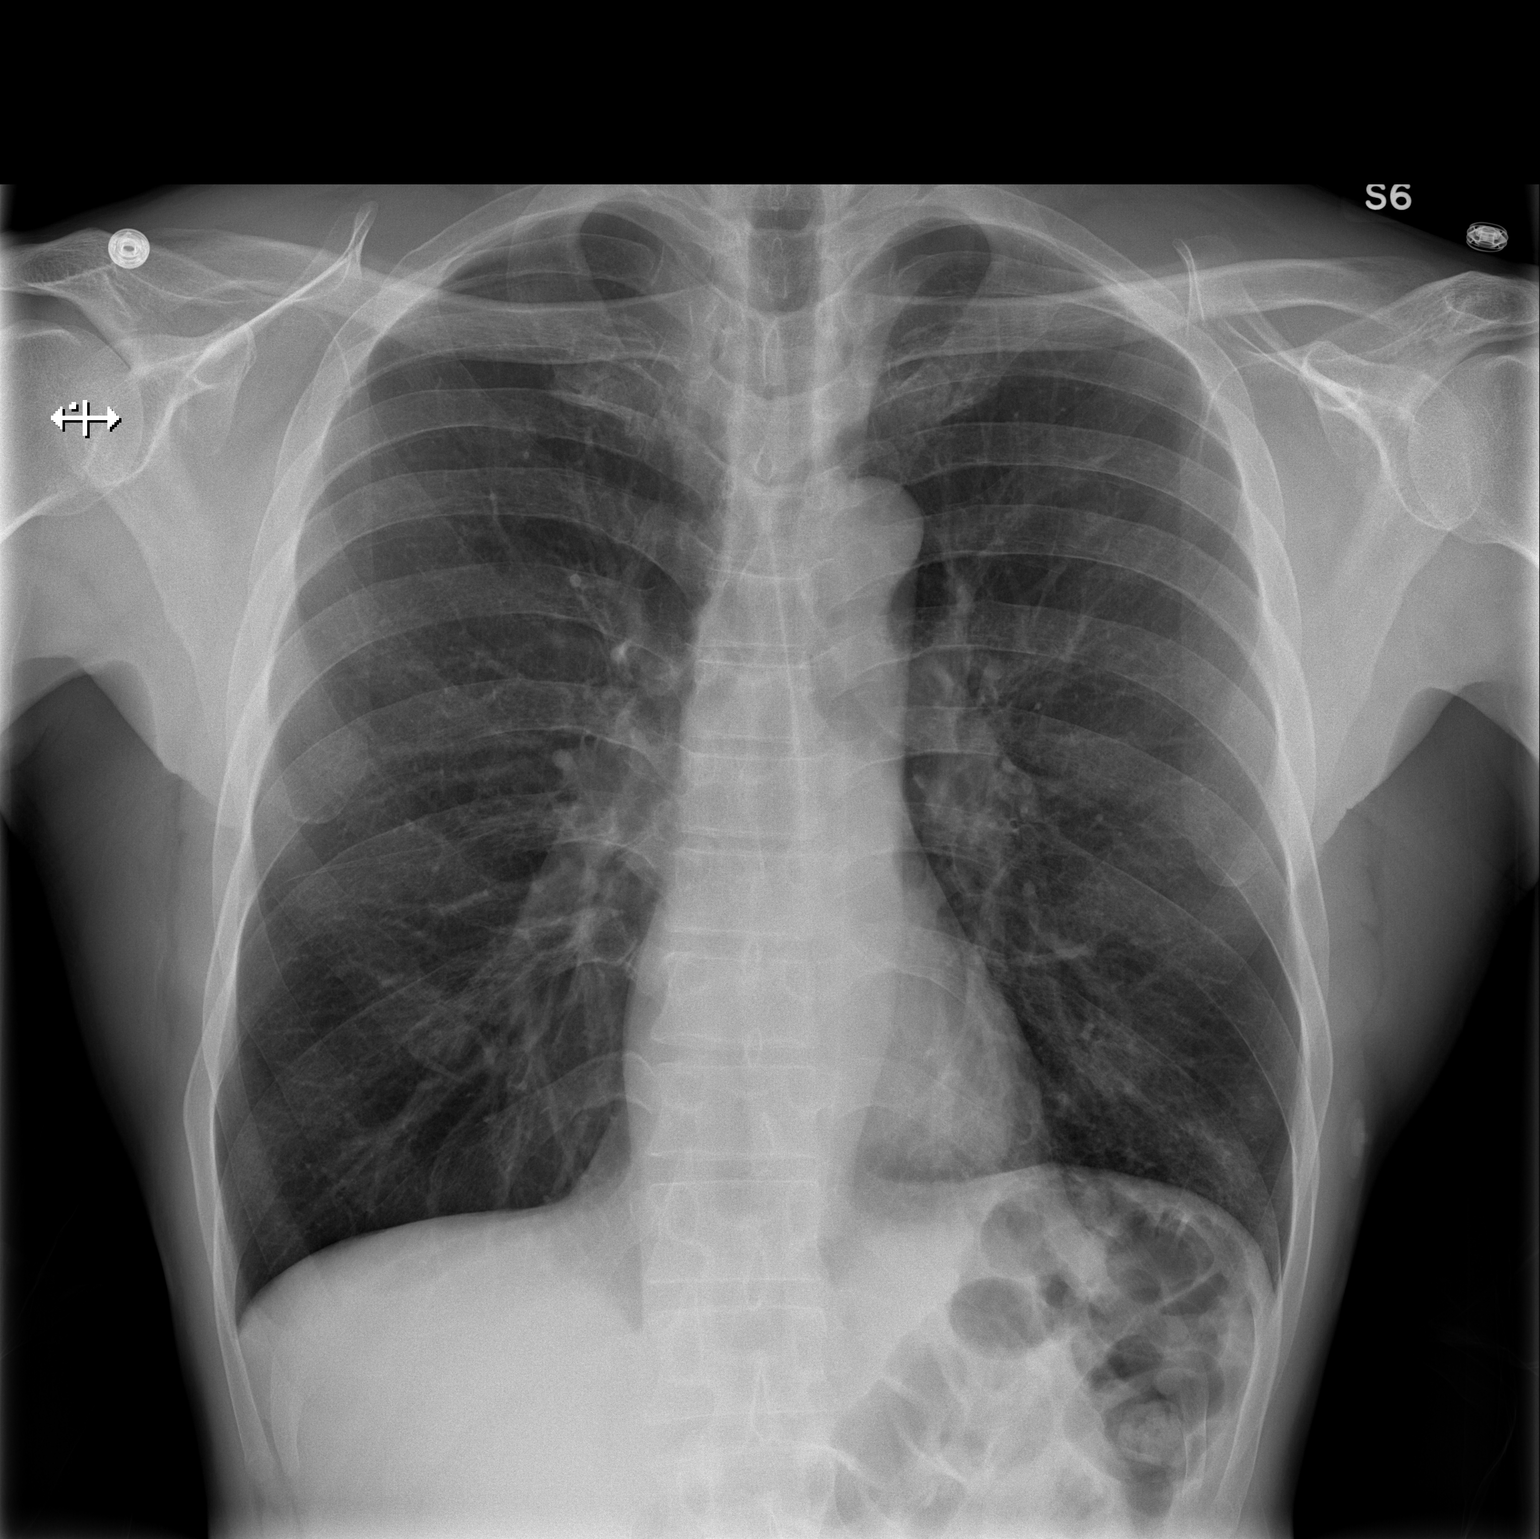

[w chest lat]
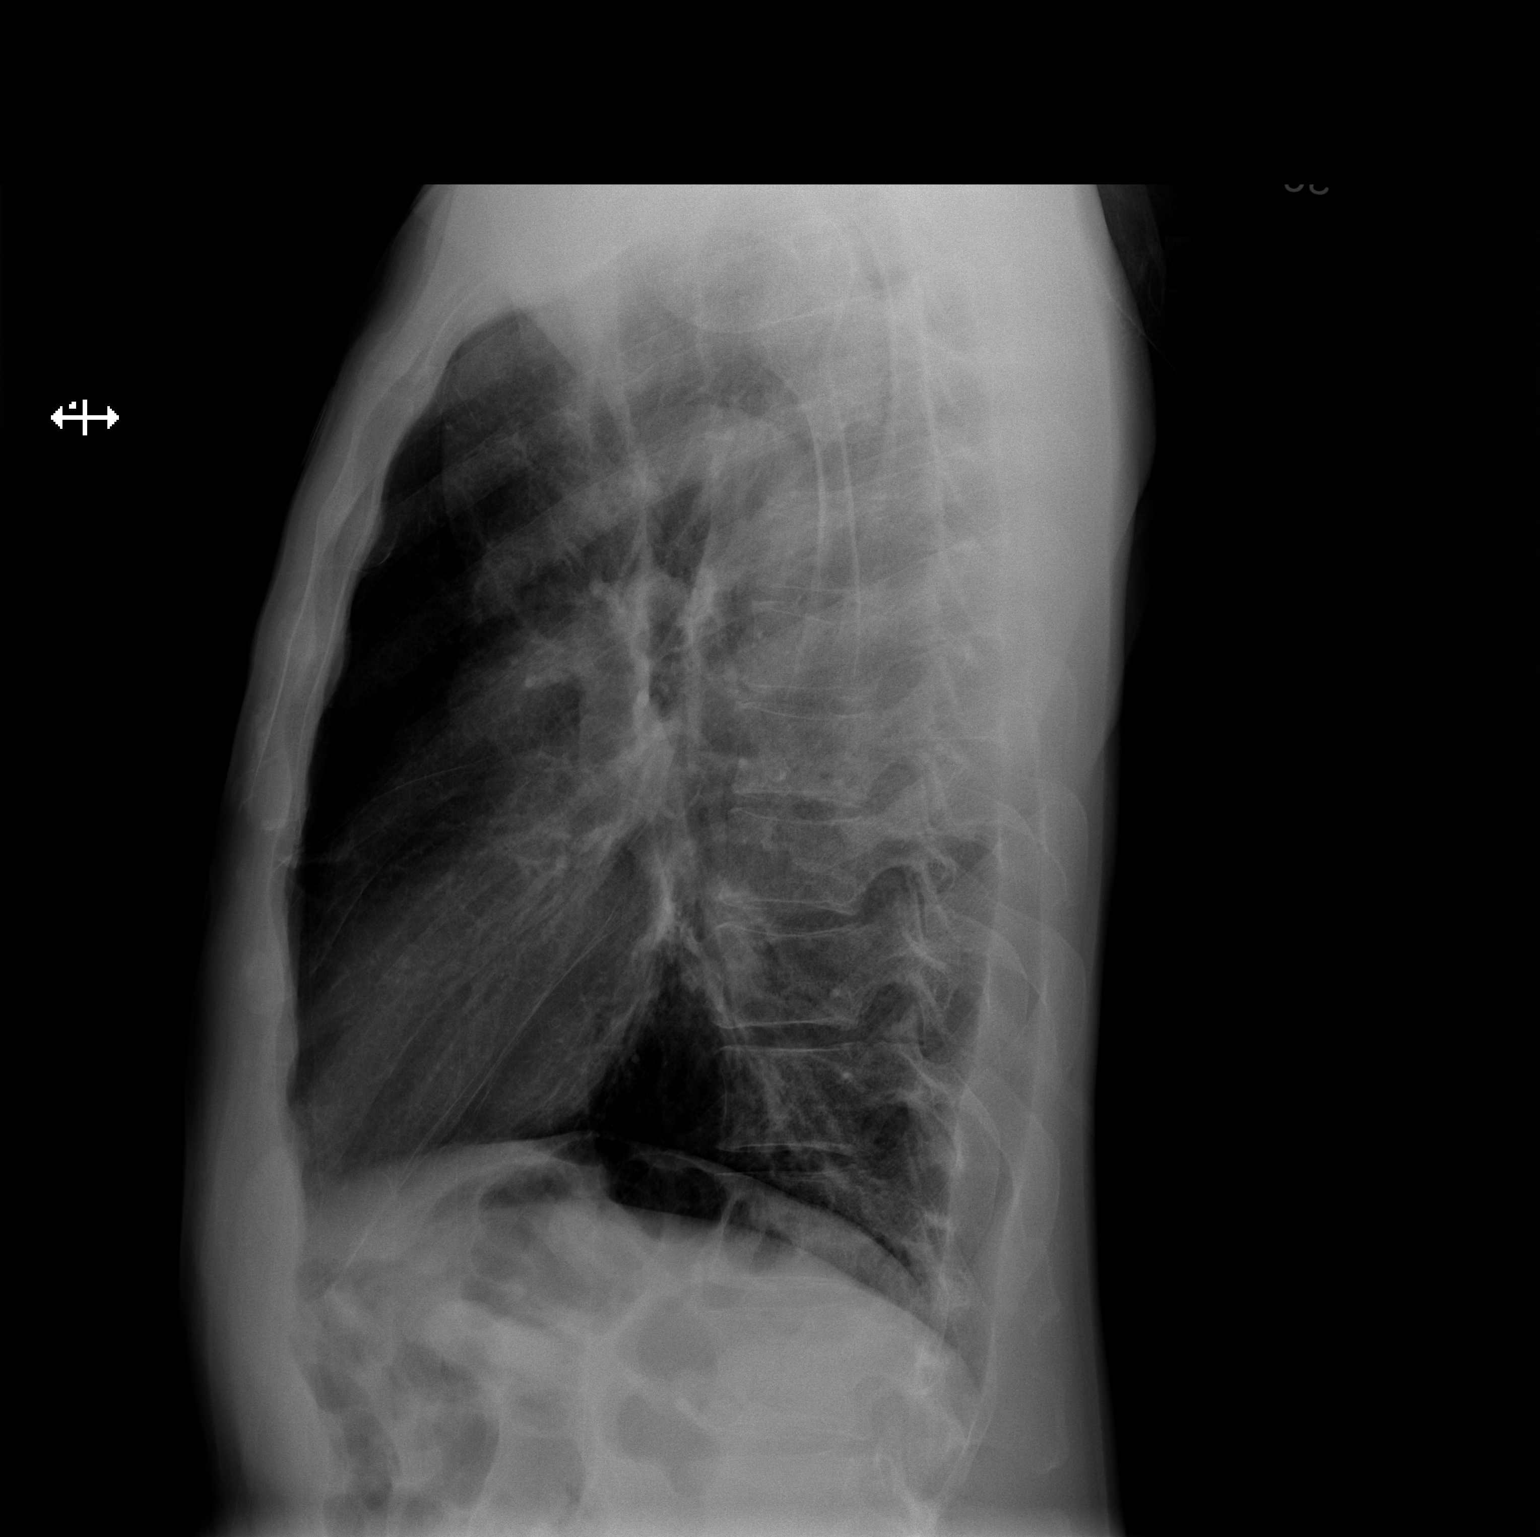

[2 of 2 positions shown; findings below may reference images not displayed]

FINDINGS: Cardiomediastinal silhouette projects within normal limits in size
and contour. No confluent airspace disease, pneumothorax, or pleural
effusion. Interstitial opacities at the left base in the
costophrenic angle.

No displaced fracture.

Unremarkable appearance of the upper abdomen.
IMPRESSION: Opacity at the left base concerning for developing
bronchitis/bronchopneumonia given the patient's history.

Early changes of emphysema.
